# Patient Record
Sex: Female | Born: 1975 | Race: Asian | Hispanic: No | Marital: Married | Smoking: Never smoker
Health system: Southern US, Community
[De-identification: ages and names within clinical notes are randomized; demographics above are authoritative.]

---

## 2020-09-24 ENCOUNTER — Other Ambulatory Visit (HOSPITAL_BASED_OUTPATIENT_CLINIC_OR_DEPARTMENT_OTHER): Payer: Self-pay

## 2020-09-24 ENCOUNTER — Emergency Department (HOSPITAL_BASED_OUTPATIENT_CLINIC_OR_DEPARTMENT_OTHER): Payer: BC Managed Care – PPO

## 2020-09-24 ENCOUNTER — Emergency Department (HOSPITAL_BASED_OUTPATIENT_CLINIC_OR_DEPARTMENT_OTHER)
Admission: EM | Admit: 2020-09-24 | Discharge: 2020-09-24 | Disposition: A | Discharge status: Home / Self Care | Payer: BC Managed Care – PPO | Attending: Emergency Medicine | Admitting: Emergency Medicine

## 2020-09-24 ENCOUNTER — Encounter (HOSPITAL_BASED_OUTPATIENT_CLINIC_OR_DEPARTMENT_OTHER): Payer: Self-pay | Admitting: *Deleted

## 2020-09-24 ENCOUNTER — Other Ambulatory Visit: Payer: Self-pay

## 2020-09-24 DIAGNOSIS — D259 Leiomyoma of uterus, unspecified: Secondary | ICD-10-CM | POA: Diagnosis not present

## 2020-09-24 DIAGNOSIS — M545 Low back pain, unspecified: Secondary | ICD-10-CM | POA: Insufficient documentation

## 2020-09-24 LAB — CBC WITH DIFFERENTIAL/PLATELET
Abs Immature Granulocytes: 0.02 10*3/uL (ref 0.00–0.07)
Basophils Absolute: 0.1 10*3/uL (ref 0.0–0.1)
Basophils Relative: 1 %
Eosinophils Absolute: 0.2 10*3/uL (ref 0.0–0.5)
Eosinophils Relative: 2 %
HCT: 41 % (ref 36.0–46.0)
Hemoglobin: 13.7 g/dL (ref 12.0–15.0)
Immature Granulocytes: 0 %
Lymphocytes Relative: 40 %
Lymphs Abs: 2.9 10*3/uL (ref 0.7–4.0)
MCH: 31 pg (ref 26.0–34.0)
MCHC: 33.4 g/dL (ref 30.0–36.0)
MCV: 92.8 fL (ref 80.0–100.0)
Monocytes Absolute: 0.4 10*3/uL (ref 0.1–1.0)
Monocytes Relative: 6 %
Neutro Abs: 3.6 10*3/uL (ref 1.7–7.7)
Neutrophils Relative %: 51 %
Platelets: 366 10*3/uL (ref 150–400)
RBC: 4.42 MIL/uL (ref 3.87–5.11)
RDW: 11.9 % (ref 11.5–15.5)
WBC: 7.2 10*3/uL (ref 4.0–10.5)
nRBC: 0 % (ref 0.0–0.2)

## 2020-09-24 LAB — COMPREHENSIVE METABOLIC PANEL
ALT: 13 U/L (ref 0–44)
AST: 18 U/L (ref 15–41)
Albumin: 4.4 g/dL (ref 3.5–5.0)
Alkaline Phosphatase: 48 U/L (ref 38–126)
Anion gap: 8 (ref 5–15)
BUN: 11 mg/dL (ref 6–20)
CO2: 27 mmol/L (ref 22–32)
Calcium: 9.4 mg/dL (ref 8.9–10.3)
Chloride: 101 mmol/L (ref 98–111)
Creatinine, Ser: 0.45 mg/dL (ref 0.44–1.00)
GFR, Estimated: >60 mL/min (ref >60)
Glucose, Bld: 102 mg/dL — ABNORMAL HIGH (ref 70–99)
Potassium: 4 mmol/L (ref 3.5–5.1)
Sodium: 136 mmol/L (ref 135–145)
Total Bilirubin: 0.3 mg/dL (ref 0.3–1.2)
Total Protein: 8.7 g/dL — ABNORMAL HIGH (ref 6.5–8.1)

## 2020-09-24 LAB — URINALYSIS, ROUTINE W REFLEX MICROSCOPIC
Bilirubin Urine: NEGATIVE
Glucose, UA: NEGATIVE mg/dL
Hgb urine dipstick: NEGATIVE
Ketones, ur: NEGATIVE mg/dL
Leukocytes,Ua: NEGATIVE
Nitrite: NEGATIVE
Protein, ur: NEGATIVE mg/dL
Specific Gravity, Urine: 1.01 (ref 1.005–1.030)
pH: 7.5 (ref 5.0–8.0)

## 2020-09-24 LAB — PREGNANCY, URINE: Preg Test, Ur: NEGATIVE

## 2020-09-24 MED ORDER — FENTANYL CITRATE (PF) 100 MCG/2ML IJ SOLN
50.0000 ug | Freq: Once | INTRAMUSCULAR | Status: AC
  Administered 2020-09-24: 50 ug via INTRAVENOUS
  Filled 2020-09-24: qty 2

## 2020-09-24 MED ORDER — ONDANSETRON HCL 4 MG/2ML IJ SOLN
4.0000 mg | Freq: Once | INTRAMUSCULAR | Status: AC
  Administered 2020-09-24: 4 mg via INTRAVENOUS
  Filled 2020-09-24: qty 2

## 2020-09-24 MED ORDER — KETOROLAC TROMETHAMINE 10 MG PO TABS
10.0000 mg | ORAL_TABLET | Freq: Four times a day (QID) | ORAL | 0 refills | Status: DC | PRN
  Filled 2020-09-24: qty 20, 5d supply, fill #0

## 2020-09-24 MED ORDER — IOHEXOL 300 MG/ML  SOLN
100.0000 mL | Freq: Once | INTRAMUSCULAR | Status: AC | PRN
  Administered 2020-09-24: 100 mL via INTRAVENOUS

## 2020-09-24 NOTE — ED Provider Notes (Signed)
[name] Provider Note   CSN: [id number] Arrival date & time: 09/24/20  1126     History Chief Complaint  Patient presents with  . Back Pain    Amber Ryan is a 45 y.o. female.  The history is provided by the patient. A language interpreter was used.  Back Pain Location:  Lumbar spine Quality:  Stabbing Radiates to: pelvis. Pain severity:  Moderate Pain is:  Same all the time Onset quality:  Gradual Duration:  6 weeks Timing:  Intermittent Progression:  Waxing and waning Chronicity:  New Context: not occupational injury, not recent illness and not recent injury   Relieved by:  OTC medications Worsened by:  Nothing Associated symptoms: abdominal pain   Associated symptoms: no bladder incontinence, no bowel incontinence, no chest pain, no dysuria, no fever, no numbness, no paresthesias, no tingling, no weakness and no weight loss        History reviewed. No pertinent past medical history.  There are no problems to display for this patient.   History reviewed. No pertinent surgical history.   OB History   No obstetric history on file.     No family history on file.  Social History   Tobacco Use  . Smoking status: Never Smoker  . Smokeless tobacco: Never Used  Substance Use Topics  . Alcohol use: Never  . Drug use: Never    Home Medications Prior to Admission medications   Not on File    Allergies    Patient has no known allergies.  Review of Systems   Review of Systems  Constitutional: Negative for chills, fever and weight loss.  HENT: Negative for ear pain and sore throat.   Eyes: Negative for pain and visual disturbance.  Respiratory: Negative for cough and shortness of breath.   Cardiovascular: Negative for chest pain and palpitations.  Gastrointestinal: Positive for abdominal pain. Negative for bowel incontinence and vomiting.  Genitourinary: Positive for frequency. Negative for bladder incontinence,  dysuria and hematuria.  Musculoskeletal: Positive for back pain. Negative for arthralgias.  Skin: Negative for color change and rash.  Neurological: Negative for tingling, seizures, syncope, weakness, numbness and paresthesias.  All other systems reviewed and are negative.   Physical Exam Updated Vital Signs BP (!) 141/87 (BP Location: Right Arm)   Pulse 66   Temp 98.1 F (36.7 C) (Oral)   Resp 14   Ht 4\' 8"  (1.422 m)   Wt 55.3 kg   SpO2 100%   BMI 27.33 kg/m   Physical Exam Vitals and nursing note reviewed.  HENT:     Head: Normocephalic and atraumatic.  Eyes:     General: No scleral icterus. Pulmonary:     Effort: Pulmonary effort is normal. No respiratory distress.  Abdominal:     Palpations: Abdomen is soft.     Tenderness: There is abdominal tenderness in the left lower quadrant. There is right CVA tenderness and left CVA tenderness. There is no guarding.  Musculoskeletal:     Cervical back: Normal range of motion.  Skin:    General: Skin is warm and dry.  Neurological:     Mental Status: She is alert.     Sensory: Sensation is intact.     Motor: Motor function is intact. No weakness.  Psychiatric:        Mood and Affect: Mood normal.     ED Results / Procedures / Treatments   Labs (all labs ordered are listed, but only abnormal results  are displayed) Labs Reviewed  URINALYSIS, ROUTINE W REFLEX MICROSCOPIC - Abnormal; Notable for the following components:      Result Value   Color, Urine STRAW (*)    All other components within normal limits  COMPREHENSIVE METABOLIC PANEL - Abnormal; Notable for the following components:   Glucose, Bld 102 (*)    Total Protein 8.7 (*)    All other components within normal limits  PREGNANCY, URINE  CBC WITH DIFFERENTIAL/PLATELET    EKG None  Radiology CT Abdomen Pelvis W Contrast  Result Date: 09/24/2020 CLINICAL DATA:  Six weeks of low back pain. EXAM: CT ABDOMEN AND PELVIS WITH CONTRAST TECHNIQUE: Multidetector  CT imaging of the abdomen and pelvis was performed using the standard protocol following bolus administration of intravenous contrast. CONTRAST:  191mL OMNIPAQUE IOHEXOL 300 MG/ML  SOLN COMPARISON:  None. FINDINGS: Lower chest: The lung bases are clear of acute process. No pleural effusion or pulmonary lesions. The heart is normal in size. No pericardial effusion. The distal esophagus and aorta are unremarkable. Hepatobiliary: Simple appearing segment 8 hepatic cyst. No worrisome hepatic lesions or intrahepatic biliary dilatation. The gallbladder appears normal. No common bile duct dilatation. Pancreas: No mass, inflammation or ductal dilatation. Spleen: Normal size.  No focal lesions. Adrenals/Urinary Tract: The adrenal glands and kidneys are unremarkable. Small low-attenuation lesion in the right kidney is likely a benign cyst. No renal or obstructing ureteral calculi or bladder calculi. Stomach/Bowel: The stomach, duodenum, small bowel and colon are grossly normal without oral contrast. No inflammatory changes, mass lesions or obstructive findings. The appendix is normal. Vascular/Lymphatic: The aorta is normal in caliber. No dissection. The branch vessels are patent. The major venous structures are patent. No mesenteric or retroperitoneal mass or adenopathy. Small scattered lymph nodes are noted. Reproductive: Retroverted uterus with numerous fibroids some of which demonstrate moderate degenerative changes. Both ovaries are unremarkable for age. Other: No pelvic mass or adenopathy. No free pelvic fluid collections. No inguinal mass or adenopathy. No abdominal wall hernia or subcutaneous lesions. Musculoskeletal: No significant findings. IMPRESSION: 1. No acute abdominal/pelvic findings, mass lesions or adenopathy. 2. Retroverted uterus with numerous fibroids some of which demonstrate moderate degeneration. Electronically Signed   By: Marijo Sanes M.D.   On: 09/24/2020 13:25    Procedures Procedures    Medications Ordered in ED Medications  fentaNYL (SUBLIMAZE) injection 50 mcg (has no administration in time range)  ondansetron (ZOFRAN) injection 4 mg (has no administration in time range)    ED Course  I have reviewed the triage vital signs and the nursing notes.  Pertinent labs & imaging results that were available during my care of the patient were reviewed by me and considered in my medical decision making (see chart for details).    MDM Rules/Calculators/A&P                          Amber Ryan states that she has had some back and pelvic pain for about 6 weeks intermittently.  Pain is relieved with over-the-counter medication.  She was evaluated here in the emergency department for evidence of musculoskeletal pathology, intra-abdominal pathology, or pelvic pathology.  She is neurologically intact, and I suspected more of an intra-abdominal or pelvic pathology based on her complaint of abdominal pain.  ED work-up was significant for uterine fibroids which are likely the source of her pain.  No evidence of UTI, pyelonephritis, diverticulitis, or other emergent condition.  She will continue to manage  her symptoms and will follow-up with GYN. Final Clinical Impression(s) / ED Diagnoses Final diagnoses:  Acute bilateral low back pain without sciatica  Uterine leiomyoma, unspecified location    Rx / DC Orders ED Discharge Orders    None       Arnaldo Natal, MD 09/24/20 1347

## 2020-09-24 NOTE — ED Triage Notes (Signed)
Lower back pain x 6 weeks.

## 2020-10-18 ENCOUNTER — Other Ambulatory Visit: Payer: Self-pay

## 2020-10-18 ENCOUNTER — Ambulatory Visit (INDEPENDENT_AMBULATORY_CARE_PROVIDER_SITE_OTHER): Payer: BC Managed Care – PPO | Admitting: Obstetrics and Gynecology

## 2020-10-18 ENCOUNTER — Encounter: Payer: Self-pay | Admitting: Obstetrics and Gynecology

## 2020-10-18 ENCOUNTER — Other Ambulatory Visit (HOSPITAL_COMMUNITY)
Admission: RE | Admit: 2020-10-18 | Discharge: 2020-10-18 | Disposition: A | Discharge status: Home / Self Care | Payer: BC Managed Care – PPO | Source: Ambulatory Visit | Attending: Obstetrics and Gynecology | Admitting: Obstetrics and Gynecology

## 2020-10-18 ENCOUNTER — Encounter: Payer: BC Managed Care – PPO | Admitting: Obstetrics and Gynecology

## 2020-10-18 VITALS — BP 128/89 | HR 79 | Ht 59.0 in | Wt 120.0 lb

## 2020-10-18 DIAGNOSIS — R1084 Generalized abdominal pain: Secondary | ICD-10-CM

## 2020-10-18 DIAGNOSIS — Z124 Encounter for screening for malignant neoplasm of cervix: Secondary | ICD-10-CM | POA: Diagnosis not present

## 2020-10-18 DIAGNOSIS — D259 Leiomyoma of uterus, unspecified: Secondary | ICD-10-CM | POA: Diagnosis not present

## 2020-10-18 LAB — POCT PREGNANCY, URINE: Preg Test, Ur: NEGATIVE

## 2020-10-18 MED ORDER — MEDROXYPROGESTERONE ACETATE 150 MG/ML IM SUSP
150.0000 mg | Freq: Once | INTRAMUSCULAR | Status: AC
Start: 2020-10-18 — End: 2020-10-18
  Administered 2020-10-18: 150 mg via INTRAMUSCULAR

## 2020-10-18 NOTE — Progress Notes (Signed)
   History:  Ms. Amber Ryan is a 45 y.o. G2P2000 who presents to clinic today for abdominal pain  Seen in emergency room and had workup for pain and was found to have innumerous fibroids -pain for a long time but has worsened for two months -pain is constant, descries as in back and pelvis -pain is worse with periods  -no other things that make pain worse - says it is just constant  -takes ibuprofen which does seem to help the pain  -hx of two cesarean sections    -periods are regular, last 4 days, moderately heavy bleeding and very painful  Has never had a pap smear  No new sexual partners     The following portions of the patient's history were reviewed and updated as appropriate: allergies, current medications, family history, past medical history, social history, past surgical history and problem list.  Review of Systems:  Review of Systems  Constitutional: Negative for chills and fever.  Gastrointestinal: Positive for abdominal pain. Negative for blood in stool, constipation, diarrhea, nausea and vomiting.  Genitourinary: Negative for dysuria.  Musculoskeletal: Negative for back pain and neck pain.  Neurological: Negative for dizziness and headaches.      Objective:  Physical Exam BP 128/89   Pulse 79   Ht 4\' 11"  (1.499 m)   Wt 120 lb (54.4 kg)   LMP 10/16/2020   BMI 24.24 kg/m  Physical Exam Vitals and nursing note reviewed. Exam conducted with a chaperone present.  Constitutional:      Appearance: Normal appearance.  Abdominal:     Palpations: Abdomen is soft.     Comments: Vertical skin incision noted Abdomen is soft and nondistended. Pain is primarily periumbilical. Some tenerness to palpation suprapubic   Genitourinary:    General: Normal vulva.     Cervix: Friability present.     Comments: Retroverted uterus, does not feel overtly enlarged on bimanual exam  Cervix appears friable  Neurological:     Mental Status: She is alert.       Labs and  Imaging No results found for this or any previous visit (from the past 24 hour(s)).  No results found.   Assessment & Plan:  1. Uterine leiomyoma, unspecified location -possible that pain is related to fibroid disease, however would consider other alternative diagnoses as pain is primarily periumbilical -discussed options with patient including monitoring, OCP's, IUD, Depo. Patient would like to trial depo and follow up. Encouraged her to continue NSAID's prn.    2. Generalized abdominal pain -as above  3. Screening for cervical cancer -pap performed with STD testing per patient request    Janet Berlin, MD 10/18/2020 3:14 PM    Sharene Skeans, MD [facility] Family Medicine Fellow, [facility], [place]

## 2020-10-18 NOTE — Patient Instructions (Addendum)
Uterine Fibroids  Uterine fibroids are lumps of tissue (tumors) in the womb (uterus). Fibroids are not cancerous. Most women with this condition do not need treatment. Sometimes, fibroids can make it harder to have children. If this happens, you may need surgery to take out the fibroids. What are the causes? The cause of this condition is not known. What increases the risk?  You are in your 30s or 40s and have not gone through menopause. Menopause is when you have not had a menstrual period for 12 months.  Having a history of fibroids in your family.  You are of African American descent.  You started your period at age 4 or younger.  You have not given birth.  You are overweight or very overweight. What are the signs or symptoms?  Bleeding between menstrual periods.  Heavy bleeding during your menstrual period.  Pain in the area between your hips.  Needing to pee (urinate) right away or more often than usual.  Not being able to have children (infertility).  Not being able to stay pregnant (miscarriage). Many women do not have symptoms.  How is this treated? Treatment may include:  Follow-up visits with your doctor to check your fibroids for any changes.  Medicines to help with pain, such as aspirin or ibuprofen.  Hormone therapy. This may be given as a pill, in a shot, or with a type of birth control device called an IUD.  Surgery that would do one of these things: ? Take out the fibroids. This may be done if you want to become pregnant. ? Take out the womb (hysterectomy). ? Stop the blood flow to the fibroids. Follow these instructions at home: Medicines  Take over-the-counter and prescription medicines only as told by your doctor.  Ask your doctor if you should: ? Take iron pills. ? Eat more foods that have a lot of iron in them, such as dark green, leafy vegetables. Managing pain If told, put heat on your back or belly. Do this as often as told by your  doctor. Use the heat source that your doctor recommends, such as a moist heat pack or a heating pad. To do this:  Put a towel between your skin and the heat pack or pad.  Leave the heat on for 20-30 minutes.  Take off the heat if your skin turns bright red. This is very important. If you cannot feel pain, heat, or cold, you may have a greater risk of getting burned.   General instructions  Tell your doctor about any changes to your menstrual period, such as: ? Heavy bleeding that needs a change of tampons or pads more than normal. ? A change in how many days your period lasts. ? A change in symptoms that come with your period. This might be belly cramps or back pain.  Keep all follow-up visits. Contact a doctor if:  You have pain that does not get better with medicine or heat. This may include pain or cramps in: ? The area between your hip bones. ? Your back. ? Your belly.  You have new bleeding between your periods.  You have more bleeding during or between your periods.  You feel very tired or weak.  You feel dizzy. Get help right away if:  You faint.  You have pain in the area between your hip bones that gets worse.  You have bleeding that soaks a tampon or pad in 30 minutes or less. Summary  Uterine fibroids are lumps of  tissue (tumors) in your womb. They are not cancerous.  Medicines such as aspirin or ibuprofen may be used to help with pain.  Contact a doctor if you have pain or cramps that do not get better with medicine.  Know the symptoms for when you should get help right away. This information is not intended to replace advice given to you by your health care provider. Make sure you discuss any questions you have with your health care provider. Document Revised: 12/27/2019 Document Reviewed: 12/27/2019 Elsevier Patient Education  [place]. Medroxyprogesterone injection [Contraceptive] What is this medicine? MEDROXYPROGESTERONE (me DROX ee proe  JES te rone) contraceptive injections prevent pregnancy. They provide effective birth control for 3 months. Depo-SubQ Provera 104 injection is also used for treating pain related to endometriosis. This medicine may be used for other purposes; ask your health care provider or pharmacist if you have questions. COMMON BRAND NAME(S): Depo-Provera, Depo-subQ Provera 104 What should I tell my health care provider before I take this medicine? They need to know if you have any of these conditions: asthma blood clots breast cancer or family history of breast cancer depression diabetes eating disorder (anorexia nervosa) heart attack high blood pressure HIV infection or AIDS if you often drink alcohol kidney disease liver disease migraine headaches osteoporosis, weak bones seizures stroke tobacco smoker vaginal bleeding an unusual or allergic reaction to medroxyprogesterone, other hormones, medicines, foods, dyes, or preservatives pregnant or trying to get pregnant breast-feeding How should I use this medicine? Depo-Provera CI contraceptive injection is given into a muscle. Depo-subQ Provera 104 injection is given under the skin. It is given by a health care provider in a hospital or clinic setting. The injection is usually given during the first 5 days after the start of a menstrual period or 6 weeks after delivery of a baby. A patient package insert for the product will be given with each prescription and refill. Be sure to read this information carefully each time. The sheet may change often. Talk to your pediatrician regarding the use of this medicine in children. Special care may be needed. These injections have been used in female children who have started having menstrual periods. Overdosage: If you think you have taken too much of this medicine contact a poison control center or emergency room at once. NOTE: This medicine is only for you. Do not share this medicine with others. What if I  miss a dose? Keep appointments for follow-up doses. You must get an injection once every 3 months. It is important not to miss your dose. Call your health care provider if you are unable to keep an appointment. What may interact with this medicine? antibiotics or medicines for infections, especially rifampin and griseofulvin antivirals for HIV or hepatitis aprepitant armodafinil bexarotene bosentan medicines for seizures like carbamazepine, felbamate, oxcarbazepine, phenytoin, phenobarbital, primidone, topiramate mitotane modafinil St. John's wort This list may not describe all possible interactions. Give your health care provider a list of all the medicines, herbs, non-prescription drugs, or dietary supplements you use. Also tell them if you smoke, drink alcohol, or use illegal drugs. Some items may interact with your medicine. What should I watch for while using this medicine? This drug does not protect you against HIV infection (AIDS) or other sexually transmitted diseases. Use of this product may cause you to lose calcium from your bones. Loss of calcium may cause weak bones (osteoporosis). Only use this product for more than 2 years if other forms of birth control are  not right for you. The longer you use this product for birth control the more likely you will be at risk for weak bones. Ask your health care professional how you can keep strong bones. You may have a change in bleeding pattern or irregular periods. Many females stop having periods while taking this drug. If you have received your injections on time, your chance of being pregnant is very low. If you think you may be pregnant, see your health care professional as soon as possible. Tell your health care professional if you want to get pregnant within the next year. The effect of this medicine may last a long time after you get your last injection. What side effects may I notice from receiving this medicine? Side effects that you  should report to your doctor or health care professional as soon as possible: allergic reactions like skin rash, itching or hives, swelling of the face, lips, or tongue blood clot (chest pain; shortness of breath; pain, swelling, or warmth in the leg) breast tenderness or discharge changes in emotions or moods changes in vision liver injury (dark yellow or brown urine; general ill feeling or flu-like symptoms; loss of appetite, right upper belly pain; unusually weak or tired, yellowing of the eyes or skin) persistent pain, pus, or bleeding at the injection site stroke (changes in vision; confusion; trouble speaking or understanding; severe headaches; sudden numbness or weakness of the face, arm or leg; trouble walking; dizziness; loss of balance or coordination) trouble breathing Side effects that usually do not require medical attention (report to your doctor or health care professional if they continue or are bothersome): change in sex drive dizziness fluid retention headache irregular periods, spotting, or absent periods pain, redness, or irritation at site where injected stomach pain weight gain This list may not describe all possible side effects. Call your doctor for medical advice about side effects. You may report side effects to FDA at 1-800-FDA-1088. Where should I keep my medicine? This injection is only given by a health care provider. It will not be stored at home. NOTE: This sheet is a summary. It may not cover all possible information. If you have questions about this medicine, talk to your doctor, pharmacist, or health care provider.  2021 Elsevier/Gold Standard (2019-07-13 10:29:21) Medroxyprogesterone injection [Contraceptive] ?y l thu?c g? Thu?c chch ng?a thai MEDROXYPROGESTERONE phng ng?a [place] nghn. Chng c kh? n?ng ng?a thai hi?u qu? trong 3 thng. Depo-SubQ Provera 104 d?ng thu?c tim c?ng ???c dng ?? ?i?u tr? ch?ng ?au c lin quan ??n b?nh l?c n?i m?c t?  cung. Thu?c ny c th? ???c dng cho nh?ng m?c ?ch khc; hy h?i ng??i cung c?p d?ch v? y t? ho?c d??c s? c?a mnh, n?u qu v? c th?c m?c. (CC) NHN HI?U PH? BI?N: Depo-Provera, Depo-subQ Provera 104 Ti c?n ph?i bo cho ng??i cung c?p d?ch v? y t? c?a mnh ?i?u g tr??c khi dng thu?c ny? H? c?n bi?t li?u qu v? c b?t k? tnh tr?ng no sau ?y khng:  hen suy?n  c?c mu ?ng  ung th? v ho?c c ti?n s? gia ?nh m?c ung th? v  tr?m c?m  b?nh ti?u ???ng  r?i lo?n ?n u?ng (ch?ng chn ?n)  nh?i mu c? tim  huy?t a?p cao  nhi?m HIV ho?c AIDS  n?u qu v? th??ng xuyn u?ng r??u  b?nh th?n  b?nh gan  ch?ng ?au n?a ??u  ch?ng long x??ng, x??ng y?u  co gi?t (kinh phong)  ??  t qu?  ng??i hu?t thu?c la?  xu?t huy?t m ??o  pha?n ??ng b?t th???ng ho??c di? ??ng v??i medroxyprogesterone  pha?n ??ng b?t th???ng ho??c di? ??ng v??i cc n?i ti?t t? ho?c cc d??c ph?m khc  pha?n ??ng b?t th???ng ho??c di? ??ng v??i th??c ph?m, thu?c nhu?m, ho??c ch?t ba?o qua?n  ?ang c thai ho??c ??nh co? thai  ?ang cho con bu? Ti nn s? d?ng thu?c ny nh? th? no? Thu?c tim ng?a [place] Depo-Provera CI ???c tim vo b?p th?t. Thu?c tim Depo-subQ Provera 104 ???c tim d??i da. Thu?c ny ???c s? d?ng b?i chuyn vin y t? ? b?nh vi?n ho?c ? phng m?ch. M?i tim th??ng ???c th?c hi?n trong 5 ngy ??u c?a k? kinh nguy?t ho?c 6 tu?n sau khi sinh con. T? thng tin dnh cho b?nh nhn s? ???c cung c?p cho t?ng toa thu?c v cho m?i l?n mua thm thu?c. Hy b?o ??m ??c k? thng tin ny m?i l?n. T? thng tin c th? thay ??i th??ng xuyn. Hy bn v?i bc s? nhi khoa c?a qu v? v? vi?c dng thu?c ny ? tr? em. C th? c?n ch?m [place] ??c bi?t. Thu?c ny ? ???c dng ? nh?ng b gi ? b?t ??u c kinh nguy?t. Qu li?u: N?u qu v? cho r?ng mnh ? dng qu nhi?u thu?c ny, th hy lin l?c v?i trung tm ki?m sot ch?t ??c ho?c phng c?p c?u ngay l?p t?c. L?U : Thu?c ny ch? dnh ring cho qu  v?. Khng chia s? thu?c ny v?i nh?ng ng??i khc. N?u ti l? qun m?t li?u th sao? Gi? ?ng cc cu?c h?n cho nh?ng li?u ti?p theo. Qu v? ph?i tim thu?c 3 thng m?t l?n. ?i?u quan tr?ng l khng nn b? l? li?u thu?c no. Hy lin l?c v?i bc s? ho?c [place] vin y t? c?a mnh, n?u qu v? khng th? gi? ?ng cu?c h?n khm. Nh?ng g c th? t??ng tc v?i thu?c ny?  cc thu?c khng sinh ho?c thu?c tr? cc b?nh nhi?m trng, ??c bi?t l rifampin v griseofulvin  cc thu?c khng virus dng ?? tr? HIV ho?c vim gan  aprepitant  armodafinil  bexarotene  bosentan  m?t s? thu?c dng cho cc ch?ng co gi?t, ch?ng h?n nh? carbamazepine, felbamate, oxcarbazepine, phenytoin, phenobarbital, primidone, topiramate  mitotane  modafinil  cy [facility] John's Wort (c? [facility]/cy n?c s?i/cy l?nh) Danh sch ny c th? khng m t? ?? h?t cc t??ng tc c th? x?y ra. Hy ??a cho ng??i cung c?p d?ch v? y t? c?a mnh danh sch t?t c? cc thu?c, th?o d??c, cc thu?c khng c?n toa, ho?c cc ch? ph?m b? sung m qu v? dng. C?ng nn bo cho h? bi?t r?ng qu v? c ht thu?c, u?ng r??u, ho?c c s? d?ng ma ty tri php hay khng. Vi th? c th? t??ng tc v?i thu?c c?a qu v?. Ti c?n ph?i theo di ?i?u g trong khi dng thu?c ny? Thu?c ny khng b?o v? cho qu v? kh?i b? nhi?m HIV ho?c AIDS ho?c cc b?nh ly qua ???ng tnh d?c khc. S? d?ng s?n ph?m ny c th? gy ra hao t?n calci t? x??ng. S? hao t?n calci c th? gy y?u x??ng (ch?ng long x??ng). Ch? s? d?ng s?n ph?m ny qu 2 n?m, n?u cc hnh th?c ng?a thai khc khng thch h?p v?i qu v?. Dng ch? ph?m ng?a thai ny cng lu, th qu v? cng d? c nguy c? b?  y?u x??ng. Hy h?i bc s? ho?c chuyn vin y t? c?a qu v? lm sao ?? gi? cho x??ng ch?c kh?e. Qu v? c th? c thay ??i trong ki?u hnh kinh ho?c c k? kinh khng ??u. Nhi?u ph? n? ng?ng th?y kinh trong khi dng thu?c ny. N?u qu v? ? ???c tim thu?c ?ng lc, th kh? n?ng c thai r?t th?p. N?u qu v? ngh?  r?ng mnh c th? c [place], th hy ??n g?p bc s? ho?c chuyn vin y t? cng s?m cng t?t. Hy bo cho bc s? ho?c chuyn vin y t?, n?u qu v? mu?n c thai n?i trong n?m t?i. Tc d?ng c?a thu?c ny c th? t?n t?i trong m?t th?i gian di sau m?i tim cu?i cng. Ti c th? nh?n th?y nh?ng tc d?ng ph? no khi dng thu?c ny? Nh?ng tc d?ng ph? qu v? c?n ph?i bo cho bc s? ho?c chuyn vin y t? cng s?m cng t?t:  cc ph?n ?ng d? ?ng, ch?ng h?n nh? da b? m?n ??, ng?a, n?i my ?ay, s?ng ? m?t, mi, ho?c l??i  c?c mu ?ng (?au ng?c; kh th?; ?au, s?ng ho?c ?m nng ? chn)   ?m ? v ho?c xu?t ti?t  cc thay ??i c?m xc ho?c tm tr?ng  thay ??i th? l?c  t?n th??ng gan (n??c ti?u c mu nu ho?c vng s?m; c c?m gic b? b?nh ki?u chung chung ho?c cc tri?u ch?ng gi?ng nh? cm; m?t c?m gic ngon mi?ng, ?au vng b?ng trn; y?u ho?c m?t m?i khc th??ng; b? vng da ho?c m?t)  ?au dai d?ng, m?ng m? ho?c ch?y mu ? ch? tim  ??t qu? (thay ??i th? l?c; l l?n; ni kh ho?c kh hi?u ?i?u ng??i khc ni; ?au ??u d? d?i; ??t ng?t b? t ho?c y?u ? m?t, tay ho?c chn; kh ?i l?i; chng m?t; m?t th?ng b?ng ho?c ph?i h?p ??ng tc)  kh th? Cc tc d?ng ph? khng c?n ph?i ch?m [place] y t? (hy bo cho bc s? ho?c chuyn vin y t?, n?u cc tc d?ng ph? ny ti?p di?n ho?c gy phi?n toi):  cc thay ??i v? ham mu?n tnh d?c  chng m?t  b? gi? n??c  ?au ??u  k? kinh khng ??u, xu?t huy?t r? r? t?ng ??t, ho?c khng th?y kinh  ?au, ??, ng?a, ho?c kch ?ng ? ch? tim  ?au b?ng  t?ng cn Danh sch ny c th? khng m t? ?? h?t cc tc d?ng ph? c th? x?y ra. Xin g?i t?i bc s? c?a mnh ?? ???c c? v?n chuyn mn v? cc tc d?ng ph?Sander Nephew v? c th? t??ng trnh cc tc d?ng ph? cho FDA theo s? [phone]. Ti nn c?t gi? thu?c c?a mnh ? ?u? Thu?c ny ch? ???c tim b?i chuyn vin y t?. Thu?c ny khng ???c c?t gi? t?i nh. L?U : ?y l b?n tm t?t. N c th? khng bao hm t?t c? thng tin c th? c.  N?u qu v? th?c m?c v? thu?c ny, xin trao ??i v?i bc s?, d??c s?, ho?c ng??i cung c?p d?ch v? y t? c?a mnh.  2021 Elsevier/Gold Standard (2019-09-29 00:00:00)

## 2020-10-23 LAB — CYTOLOGY - PAP
Adequacy: ABSENT
Chlamydia: NEGATIVE
Comment: NEGATIVE
Comment: NEGATIVE
Comment: NORMAL
Diagnosis: NEGATIVE
High risk HPV: NEGATIVE
Neisseria Gonorrhea: NEGATIVE

## 2021-01-10 ENCOUNTER — Other Ambulatory Visit: Payer: Self-pay

## 2021-01-10 ENCOUNTER — Ambulatory Visit (INDEPENDENT_AMBULATORY_CARE_PROVIDER_SITE_OTHER): Payer: BC Managed Care – PPO

## 2021-01-10 VITALS — BP 138/81 | HR 104 | Wt 121.0 lb

## 2021-01-10 DIAGNOSIS — Z3042 Encounter for surveillance of injectable contraceptive: Secondary | ICD-10-CM | POA: Diagnosis not present

## 2021-01-10 MED ORDER — MEDROXYPROGESTERONE ACETATE 150 MG/ML IM SUSP
150.0000 mg | Freq: Once | INTRAMUSCULAR | Status: AC
  Administered 2021-01-10: 150 mg via INTRAMUSCULAR

## 2021-01-10 NOTE — Progress Notes (Signed)
Amber Ryan here for Depo-Provera Injection. Injection administered without complication. Patient will return in 3 months for next injection between October 20 and November 3. Next annual visit due 10/2022.   Amber Carmine, RN 01/10/2021  10:43 AM

## 2021-01-13 NOTE — Progress Notes (Signed)
Chart reviewed for nurse visit. Agree with plan of care.   [place], [place] 01/13/2021 7:51 PM

## 2021-04-03 ENCOUNTER — Ambulatory Visit (INDEPENDENT_AMBULATORY_CARE_PROVIDER_SITE_OTHER): Payer: BC Managed Care – PPO

## 2021-04-03 ENCOUNTER — Other Ambulatory Visit: Payer: Self-pay

## 2021-04-03 VITALS — BP 130/83 | HR 81 | Wt 123.9 lb

## 2021-04-03 DIAGNOSIS — Z3042 Encounter for surveillance of injectable contraceptive: Secondary | ICD-10-CM | POA: Diagnosis not present

## 2021-04-03 MED ORDER — MEDROXYPROGESTERONE ACETATE 150 MG/ML IM SUSP
150.0000 mg | Freq: Once | INTRAMUSCULAR | Status: AC
  Administered 2021-04-03: 150 mg via INTRAMUSCULAR

## 2021-04-03 NOTE — Progress Notes (Signed)
Amber Ryan here for Depo-Provera Injection. Injection administered without complication. Patient will return in 3 months for next injection between January 11 and January 25. Next annual visit due 10/19/2021.  Patient also reports that her periods are lasting longer than usual.  I explained to the patient that it usually takes at least three injections of Depo Provera before her period regulates.  I verified with patient if she is having concerns with vaginal bleeding.  Patient confirmed that she is not concerned with bleeding just that her periods last longer.  I encouraged patient to get this injection as it is her third and if she is still having concerns to please give the office a call.  Pt reports that she will be leaving to go to [place] on January 5th and patients next Depo is due January 11th.  I advised that she can come on January 4th for early administration of Depo Provera due to her being out of town and not returning until March 8th.    [name], RN 04/03/2021  11:33 AM

## 2021-06-12 ENCOUNTER — Other Ambulatory Visit: Payer: Self-pay

## 2021-06-12 ENCOUNTER — Ambulatory Visit (INDEPENDENT_AMBULATORY_CARE_PROVIDER_SITE_OTHER): Payer: BC Managed Care – PPO

## 2021-06-12 VITALS — BP 134/77 | HR 82 | Wt 123.0 lb

## 2021-06-12 DIAGNOSIS — Z3042 Encounter for surveillance of injectable contraceptive: Secondary | ICD-10-CM

## 2021-06-12 MED ORDER — MEDROXYPROGESTERONE ACETATE 150 MG/ML IM SUSP
150.0000 mg | Freq: Once | INTRAMUSCULAR | Status: AC
  Administered 2021-06-12: 150 mg via INTRAMUSCULAR

## 2021-06-12 NOTE — Progress Notes (Signed)
Amber Ryan here prior to scheduled window for Depo-Provera Injection due to travel plans. Reviewed with [name], MD who states pt may receive injection early. Injection administered without complication. Based on Depo Provera calendar patient should return 08/28/21 and 09/11/21 for next injection. Patient will return 09/18/21 when she arrives home from travel. Recommended patient use back up form of contraception following 09/11/21. Next annual visit due May 2023. Encounter completed with [place] ID [id number].  [name], RN 06/12/2021  9:48 AM

## 2021-06-12 NOTE — Progress Notes (Signed)
Chart reviewed for nurse visit. Agree with plan of care.   Clarnce Flock, MD 06/12/21 12:02 PM

## 2021-09-18 ENCOUNTER — Ambulatory Visit (INDEPENDENT_AMBULATORY_CARE_PROVIDER_SITE_OTHER): Payer: BC Managed Care – PPO

## 2021-09-18 VITALS — BP 136/88 | HR 84 | Wt 127.5 lb

## 2021-09-18 DIAGNOSIS — Z3042 Encounter for surveillance of injectable contraceptive: Secondary | ICD-10-CM

## 2021-09-18 LAB — POCT PREGNANCY, URINE: Preg Test, Ur: NEGATIVE

## 2021-09-18 MED ORDER — MEDROXYPROGESTERONE ACETATE 150 MG/ML IM SUSP
150.0000 mg | Freq: Once | INTRAMUSCULAR | Status: AC
  Administered 2021-09-18: 150 mg via INTRAMUSCULAR

## 2021-09-18 NOTE — Progress Notes (Signed)
Amber Ryan here for Depo-Provera Injection. Injection administered without complication. Patient will return in 3 months for next injection between June 28 and July 12. Next annual visit will be scheduled with next depo provera injection.   Obtained UPT today due to pt being 12 days late.  Pt also reports that her LMP is 09/08/21.  Pt reports that she was seen in [place] by a provider and was told that she no longer had fibroids.  Pt states that she would like to stop taking the Depo Provera at this time since the fibroids are gone.  I advised pt that she does need an annual exam in which at that time she will be discuss with the provider at that time.  Pt verbalized understanding with no further questions.  ? ?[name], RN ?   10:34 AM  ?

## 2021-11-27 ENCOUNTER — Ambulatory Visit: Payer: BC Managed Care – PPO | Admitting: Family Medicine

## 2022-01-06 ENCOUNTER — Encounter: Payer: Self-pay | Admitting: Obstetrics and Gynecology

## 2022-01-06 ENCOUNTER — Other Ambulatory Visit: Payer: Self-pay

## 2022-01-06 ENCOUNTER — Ambulatory Visit (INDEPENDENT_AMBULATORY_CARE_PROVIDER_SITE_OTHER): Payer: BC Managed Care – PPO | Admitting: Obstetrics and Gynecology

## 2022-01-06 VITALS — BP 134/84 | HR 72 | Ht 59.0 in | Wt 129.7 lb

## 2022-01-06 DIAGNOSIS — Z01411 Encounter for gynecological examination (general) (routine) with abnormal findings: Secondary | ICD-10-CM

## 2022-01-06 DIAGNOSIS — D219 Benign neoplasm of connective and other soft tissue, unspecified: Secondary | ICD-10-CM

## 2022-01-06 DIAGNOSIS — Z1239 Encounter for other screening for malignant neoplasm of breast: Secondary | ICD-10-CM

## 2022-01-06 DIAGNOSIS — Z01419 Encounter for gynecological examination (general) (routine) without abnormal findings: Secondary | ICD-10-CM | POA: Insufficient documentation

## 2022-01-06 NOTE — Progress Notes (Signed)
Amber Ryan is a 46 y.o. [id number] female here for a routine annual gynecologic exam.  Current complaints: uterine fibroids.   Denies abnormal vaginal bleeding, discharge, pelvic pain, problems with intercourse or other gynecologic concerns.    Gynecologic History No LMP recorded. Patient has had an injection. Contraception: Depo-Provera injections Last Pap: 6/22. Results were: normal Last mammogram: needs  Obstetric History OB History  Gravida Para Term Preterm AB Living  '2 2 2        '$ SAB IAB Ectopic Multiple Live Births               # Outcome Date GA Lbr Len/2nd Weight Sex Delivery Anes PTL Lv  2 Term      CS-Unspec     1 Term      CS-Unspec       History reviewed. No pertinent past medical history.  Past Surgical History:  Procedure Laterality Date   CESAREAN SECTION      Current Outpatient Medications on File Prior to Visit  Medication Sig Dispense Refill   medroxyPROGESTERone (DEPO-PROVERA) 150 MG/ML injection Inject 150 mg into the muscle every 3 (three) months.     No current facility-administered medications on file prior to visit.    No Known Allergies  Social History   Socioeconomic History   Marital status: Married    Spouse name: Not on file   Number of children: Not on file   Years of education: Not on file   Highest education level: Not on file  Occupational History   Not on file  Tobacco Use   Smoking status: Never   Smokeless tobacco: Never  Vaping Use   Vaping Use: Never used  Substance and Sexual Activity   Alcohol use: Never   Drug use: Never   Sexual activity: Yes    Birth control/protection: Condom  Other Topics Concern   Not on file  Social History Narrative   Not on file   Social Determinants of Health   Financial Resource Strain: Not on file  Food Insecurity: No Food Insecurity (10/18/2020)   Hunger Vital Sign    Worried About Running Out of Food in the Last Year: Never true    Ran Out of Food in the Last Year: Never true   Transportation Needs: No Transportation Needs (10/18/2020)   PRAPARE - Hydrologist (Medical): No    Lack of Transportation (Non-Medical): No  Physical Activity: Not on file  Stress: Not on file  Social Connections: Not on file  Intimate Partner Violence: Not on file    History reviewed. No pertinent family history.  The following portions of the patient's history were reviewed and updated as appropriate: allergies, current medications, past family history, past medical history, past social history, past surgical history and problem list.  Review of Systems Pertinent items noted in HPI and remainder of comprehensive ROS otherwise negative.   Objective:  BP 134/84   Pulse 72   Ht '4\' 11"'$  (1.499 m)   Wt 129 lb 11.2 oz (58.8 kg)   BMI 26.20 kg/m  CONSTITUTIONAL: Well-developed, well-nourished female in no acute distress.  HENT:  Normocephalic, atraumatic, External right and left ear normal. Oropharynx is clear and moist EYES: Conjunctivae and EOM are normal. Pupils are equal, round, and reactive to light. No scleral icterus.  NECK: Normal range of motion, supple, no masses.  Normal thyroid.  SKIN: Skin is warm and dry. No rash noted. Not diaphoretic. No erythema. No pallor.  [place]: Alert and oriented to person, place, and time. Normal reflexes, muscle tone coordination. No cranial nerve deficit noted. PSYCHIATRIC: Normal mood and affect. Normal behavior. Normal judgment and thought content. CARDIOVASCULAR: Normal heart rate noted, regular rhythm RESPIRATORY: Clear to auscultation bilaterally. Effort and breath sounds normal, no problems with respiration noted. BREASTS: Deferred ABDOMEN: Soft, normal bowel sounds, no distention noted.  No tenderness, rebound or guarding.  PELVIC: [place] MUSCULOSKELETAL: Normal range of motion. No tenderness.  No cyanosis, clubbing, or edema.  2+ distal pulses.   Assessment:  Annual gynecologic examination without  pap smear Uterine fibroids   Plan:  Pt would like to know if she still has uterine fibroids before continuing with Depo Provera. Will check GYN U/S Mammogram scheduled Routine preventative health maintenance measures emphasized. Please refer to After Visit Summary for other counseling recommendations.    Chancy Milroy, MD, [place] Attending [place] for [facility], [place]

## 2022-01-06 NOTE — Patient Instructions (Signed)

## 2022-01-06 NOTE — Progress Notes (Signed)
From last Depo Injection on 09/18/21:   Amber Ryan here for Depo-Provera Injection. Injection administered without complication. Patient will return in 3 months for next injection between June 28 and July 12. Next annual visit will be scheduled with next depo provera injection.   Obtained UPT today due to pt being 12 days late.  Pt also reports that her LMP is 09/08/21.  Pt reports that she was seen in [place] by a provider and was told that she no longer had fibroids.  Pt states that she would like to stop taking the Depo Provera at this time since the fibroids are gone.  I advised pt that she does need an annual exam in which at that time she will be discuss with the provider at that time.  Pt verbalized understanding with no further questions.    [name], RN    10:34 AM

## 2022-01-13 ENCOUNTER — Ambulatory Visit (HOSPITAL_COMMUNITY)
Admission: RE | Admit: 2022-01-13 | Discharge: 2022-01-13 | Disposition: A | Discharge status: Home / Self Care | Payer: BC Managed Care – PPO | Source: Ambulatory Visit | Attending: Obstetrics and Gynecology | Admitting: Obstetrics and Gynecology

## 2022-01-13 DIAGNOSIS — D219 Benign neoplasm of connective and other soft tissue, unspecified: Secondary | ICD-10-CM | POA: Diagnosis present

## 2022-01-15 ENCOUNTER — Ambulatory Visit
Admission: RE | Admit: 2022-01-15 | Discharge: 2022-01-15 | Disposition: A | Discharge status: Home / Self Care | Payer: BC Managed Care – PPO | Source: Ambulatory Visit | Attending: Obstetrics and Gynecology | Admitting: Obstetrics and Gynecology

## 2022-01-15 DIAGNOSIS — Z1239 Encounter for other screening for malignant neoplasm of breast: Secondary | ICD-10-CM

## 2022-01-20 ENCOUNTER — Telehealth: Payer: Self-pay

## 2022-01-20 NOTE — Telephone Encounter (Addendum)
-----   Message from Chancy Milroy, MD sent at 01/14/2022  3:29 PM EDT ----- Please let pt know that she still has uterine fibroids.  Thanks Legrand Como  --------------------------------------------------  Called pt with [facility] interpreter ID [phone]. Called pt and patient contact; calls cannot be completed at this time.

## 2022-01-22 NOTE — Telephone Encounter (Signed)
Called patient with pacific interpreter [phone] and informed patient of ultrasound showing fibroids. Patient verbalized understanding and states she would like to restart depo. Scheduled appt for 8/21 @ 9am

## 2022-01-27 ENCOUNTER — Other Ambulatory Visit: Payer: Self-pay

## 2022-01-27 ENCOUNTER — Ambulatory Visit (INDEPENDENT_AMBULATORY_CARE_PROVIDER_SITE_OTHER): Payer: BC Managed Care – PPO | Admitting: *Deleted

## 2022-01-27 VITALS — BP 139/77 | HR 65 | Ht 59.06 in | Wt 128.5 lb

## 2022-01-27 DIAGNOSIS — Z3042 Encounter for surveillance of injectable contraceptive: Secondary | ICD-10-CM

## 2022-01-27 MED ORDER — MEDROXYPROGESTERONE ACETATE 150 MG/ML IM SUSP
150.0000 mg | Freq: Once | INTRAMUSCULAR | Status: AC
  Administered 2022-01-27: 150 mg via INTRAMUSCULAR

## 2022-01-27 NOTE — Progress Notes (Signed)
Pt presents to re-start Depo Provera injections.  She reports last sexual intercourse was one month ago. She had normal LMP on 01/14/22. Depo Provera 150 mg IM administered without difficulty.  Pt tolerated well. Next injection due 11/6-11/20. Last Pap on 10/18/20 - normal. Needs next Annual Gyn exam after 01/07/23.

## 2022-04-14 ENCOUNTER — Other Ambulatory Visit: Payer: Self-pay

## 2022-04-14 ENCOUNTER — Ambulatory Visit (INDEPENDENT_AMBULATORY_CARE_PROVIDER_SITE_OTHER): Payer: BC Managed Care – PPO | Admitting: *Deleted

## 2022-04-14 VITALS — BP 117/80 | HR 66 | Ht 59.0 in | Wt 130.7 lb

## 2022-04-14 DIAGNOSIS — Z3042 Encounter for surveillance of injectable contraceptive: Secondary | ICD-10-CM | POA: Diagnosis not present

## 2022-04-14 MED ORDER — MEDROXYPROGESTERONE ACETATE 150 MG/ML IM SUSP
150.0000 mg | Freq: Once | INTRAMUSCULAR | Status: AC
  Administered 2022-04-14: 150 mg via INTRAMUSCULAR

## 2022-04-14 NOTE — Progress Notes (Signed)
Here for depo-provera. Had last injection 01/27/22. Had annual and pap 01/06/22. Injection given without complaint. Will make appointment at checkout for next injection 06/30/22-07/14/22.  Staci Acosta

## 2022-04-21 NOTE — Progress Notes (Signed)
Patient was assessed and managed by nursing staff during this encounter. I have reviewed the chart and agree with the documentation and plan. I have also made any necessary editorial changes.  Aletha Halim, MD 04/21/2022 7:12 PM

## 2022-06-22 IMAGING — CT CT ABD-PELV W/ CM
2 of 5 series · 16 of 46 positions shown, 18 images · IV contrast (Omnipaque)
Comparison: None.

CLINICAL DATA: Six weeks of low back pain.

EXAM:
CT ABDOMEN AND PELVIS WITH CONTRAST
TECHNIQUE: Multidetector CT imaging of the abdomen and pelvis was performed
using the standard protocol following bolus administration of
intravenous contrast.
CONTRAST:  100mL OMNIPAQUE IOHEXOL 300 MG/ML  SOLN

[Series 2: axial st · axial · 0.86mm/px · z∈[-406,-1]mm · 13 of 91 slices shown, 15 images]
[im 5/91  soft-tissue]
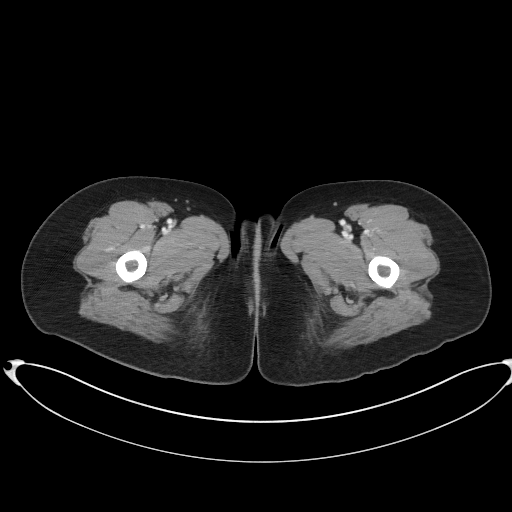
[im 5/91  bone]
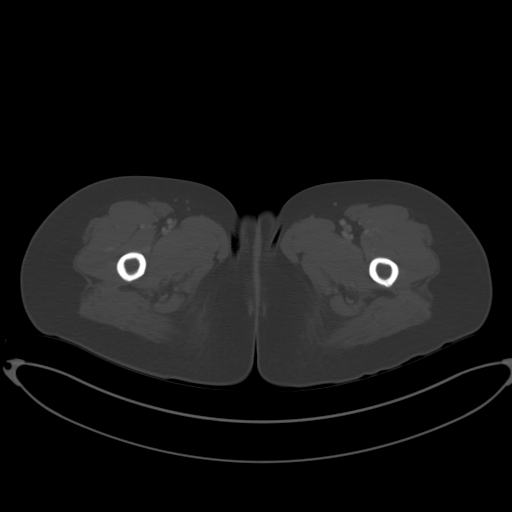
[im 14/91  soft-tissue]
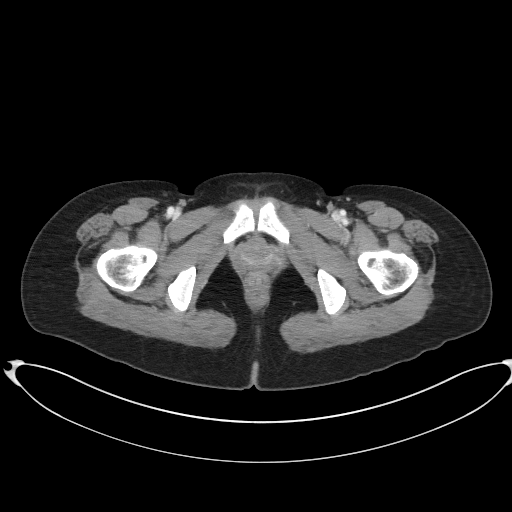
[im 19/91  soft-tissue]
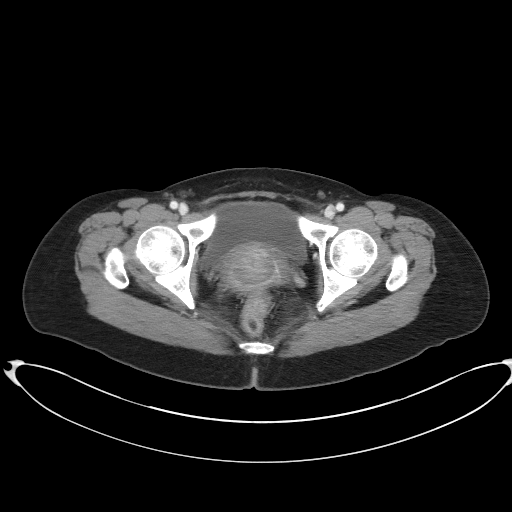
[im 28/91  soft-tissue]
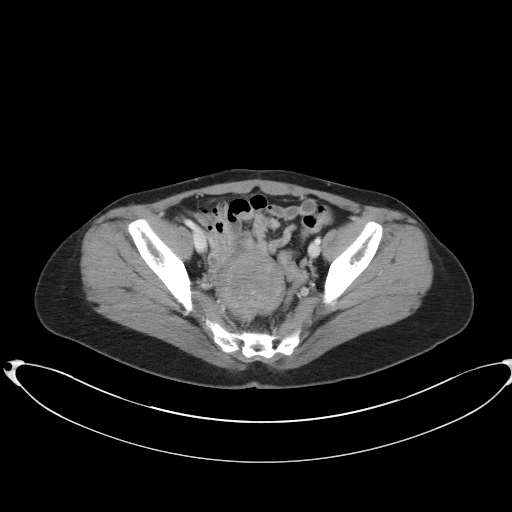
[im 32/91  soft-tissue]
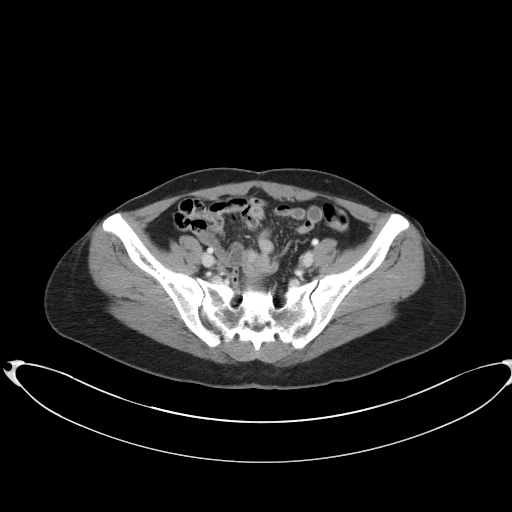
[im 41/91  soft-tissue]
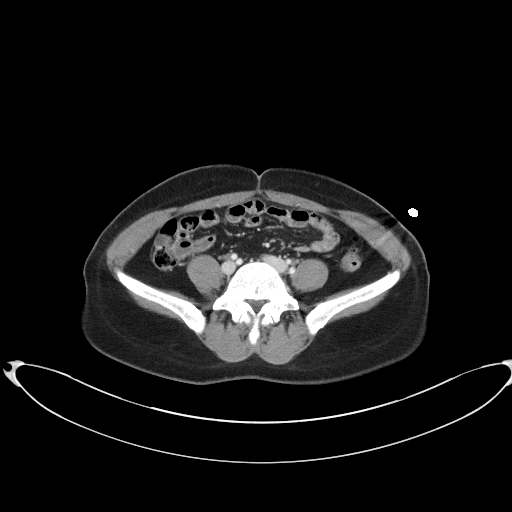
[im 46/91  soft-tissue]
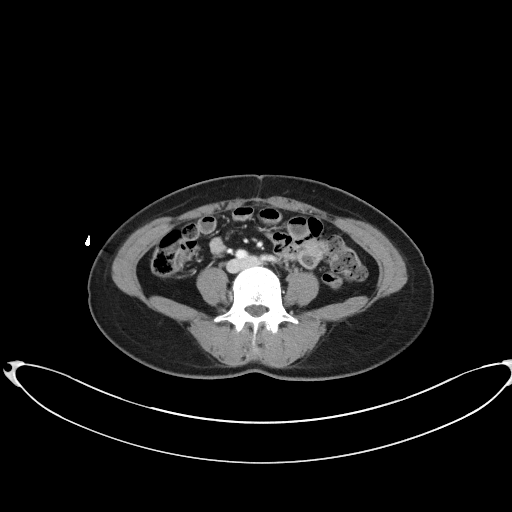
[im 50/91  soft-tissue]
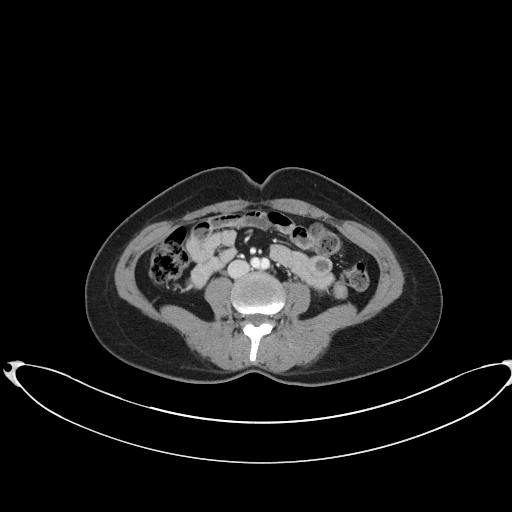
[im 59/91  soft-tissue]
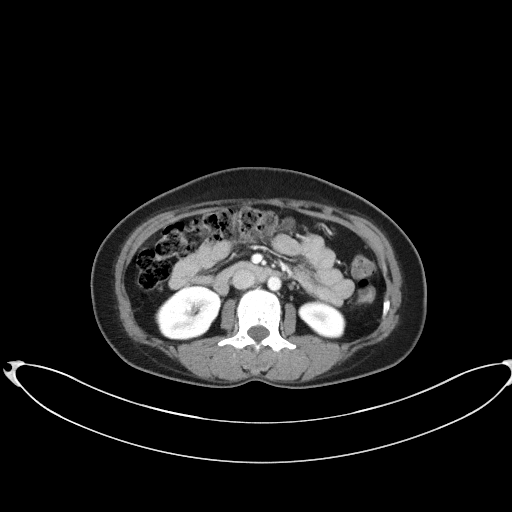
[im 59/91  bone]
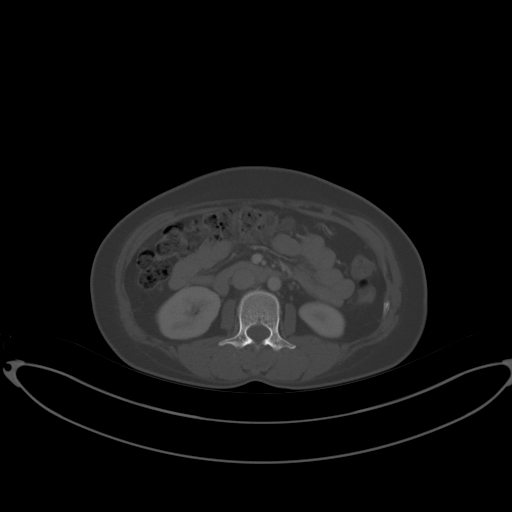
[im 64/91  soft-tissue]
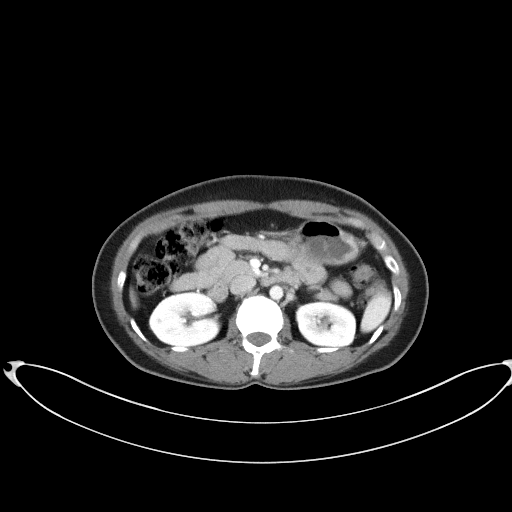
[im 73/91  soft-tissue]
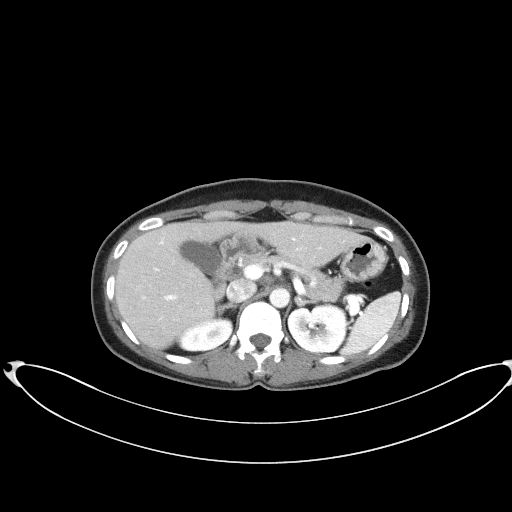
[im 77/91  soft-tissue]
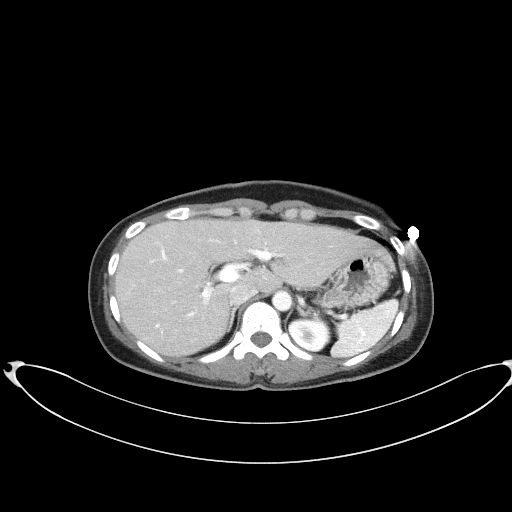
[im 86/91  soft-tissue]
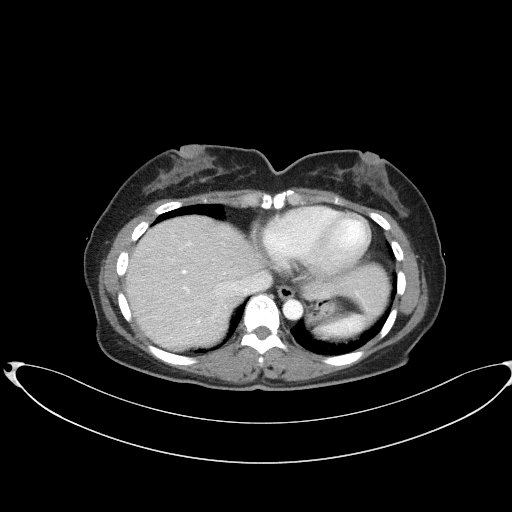

[Series 5: coronal st · coronal · 0.86mm/px · 3 of 74 slices shown]
[im 25/74  soft-tissue]
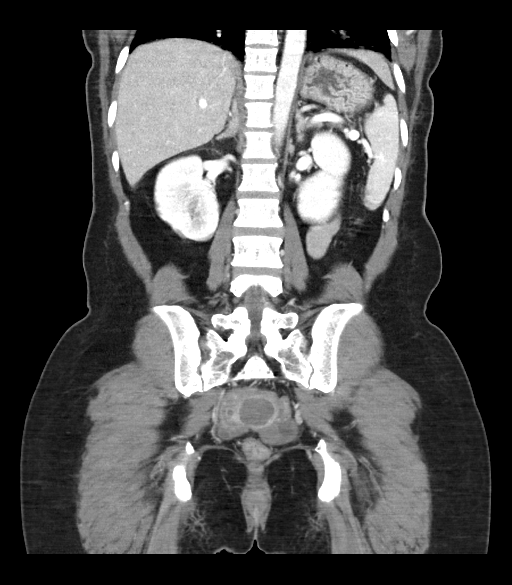
[im 33/74  soft-tissue]
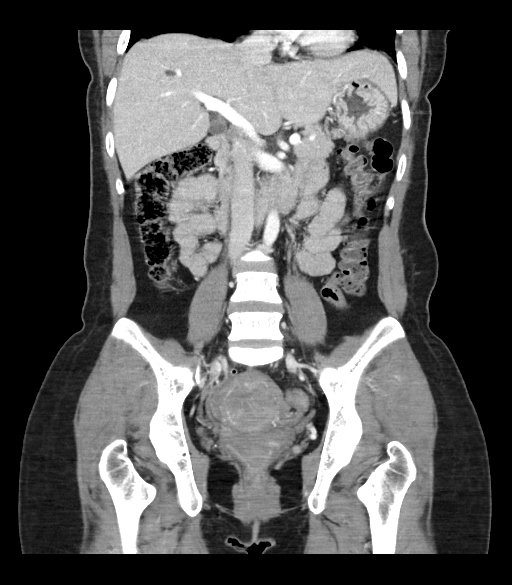
[im 41/74  soft-tissue]
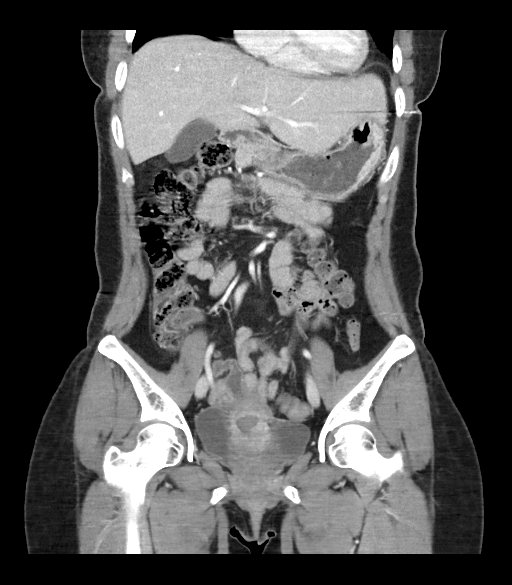

[16 of 46 positions shown; findings below may reference images not displayed]

FINDINGS: Lower chest: The lung bases are clear of acute process. No pleural
effusion or pulmonary lesions. The heart is normal in size. No
pericardial effusion. The distal esophagus and aorta are
unremarkable.

Hepatobiliary: Simple appearing segment 8 hepatic cyst. No worrisome
hepatic lesions or intrahepatic biliary dilatation. The gallbladder
appears normal. No common bile duct dilatation.

Pancreas: No mass, inflammation or ductal dilatation.

Spleen: Normal size.  No focal lesions.

Adrenals/Urinary Tract: The adrenal glands and kidneys are
unremarkable. Small low-attenuation lesion in the right kidney is
likely a benign cyst. No renal or obstructing ureteral calculi or
bladder calculi.

Stomach/Bowel: The stomach, duodenum, small bowel and colon are
grossly normal without oral contrast. No inflammatory changes, mass
lesions or obstructive findings. The appendix is normal.

Vascular/Lymphatic: The aorta is normal in caliber. No dissection.
The branch vessels are patent. The major venous structures are
patent. No mesenteric or retroperitoneal mass or adenopathy. Small
scattered lymph nodes are noted.

Reproductive: Retroverted uterus with numerous fibroids some of
which demonstrate moderate degenerative changes. Both ovaries are
unremarkable for age.

Other: No pelvic mass or adenopathy. No free pelvic fluid
collections. No inguinal mass or adenopathy. No abdominal wall
hernia or subcutaneous lesions.

Musculoskeletal: No significant findings.
IMPRESSION: 1. No acute abdominal/pelvic findings, mass lesions or adenopathy.
2. Retroverted uterus with numerous fibroids some of which
demonstrate moderate degeneration.

## 2022-06-30 ENCOUNTER — Ambulatory Visit: Payer: BC Managed Care – PPO
